# Patient Record
Sex: Male | Born: 1945
Health system: Southern US, Community
[De-identification: ages and names within clinical notes are randomized; demographics above are authoritative.]

## PROBLEM LIST (undated history)

## (undated) DIAGNOSIS — E7800 Pure hypercholesterolemia, unspecified: Secondary | ICD-10-CM

## (undated) DIAGNOSIS — R7303 Prediabetes: Secondary | ICD-10-CM

## (undated) DIAGNOSIS — I10 Essential (primary) hypertension: Secondary | ICD-10-CM

## (undated) DIAGNOSIS — D869 Sarcoidosis, unspecified: Secondary | ICD-10-CM

## (undated) DIAGNOSIS — J449 Chronic obstructive pulmonary disease, unspecified: Secondary | ICD-10-CM

---
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
## 2018-11-19 ENCOUNTER — Emergency Department (HOSPITAL_BASED_OUTPATIENT_CLINIC_OR_DEPARTMENT_OTHER)
Admission: EM | Admit: 2018-11-19 | Discharge: 2018-11-19 | Disposition: A | Discharge status: Home / Self Care | Payer: Medicare Other | Attending: Emergency Medicine | Admitting: Emergency Medicine

## 2018-11-19 ENCOUNTER — Emergency Department (HOSPITAL_BASED_OUTPATIENT_CLINIC_OR_DEPARTMENT_OTHER): Payer: Medicare Other

## 2018-11-19 ENCOUNTER — Other Ambulatory Visit: Payer: Self-pay

## 2018-11-19 ENCOUNTER — Encounter (HOSPITAL_BASED_OUTPATIENT_CLINIC_OR_DEPARTMENT_OTHER): Payer: Self-pay

## 2018-11-19 DIAGNOSIS — Z20828 Contact with and (suspected) exposure to other viral communicable diseases: Secondary | ICD-10-CM | POA: Diagnosis not present

## 2018-11-19 DIAGNOSIS — R509 Fever, unspecified: Secondary | ICD-10-CM | POA: Diagnosis not present

## 2018-11-19 DIAGNOSIS — Z87891 Personal history of nicotine dependence: Secondary | ICD-10-CM | POA: Insufficient documentation

## 2018-11-19 DIAGNOSIS — I10 Essential (primary) hypertension: Secondary | ICD-10-CM | POA: Diagnosis not present

## 2018-11-19 DIAGNOSIS — R1084 Generalized abdominal pain: Secondary | ICD-10-CM | POA: Diagnosis not present

## 2018-11-19 HISTORY — DX: Essential (primary) hypertension: I10

## 2018-11-19 HISTORY — DX: Pure hypercholesterolemia, unspecified: E78.00

## 2018-11-19 LAB — COMPREHENSIVE METABOLIC PANEL
ALT: 22 U/L (ref 0–44)
AST: 31 U/L (ref 15–41)
Albumin: 3.8 g/dL (ref 3.5–5.0)
Alkaline Phosphatase: 62 U/L (ref 38–126)
Anion gap: 13 (ref 5–15)
BUN: 9 mg/dL (ref 8–23)
CO2: 25 mmol/L (ref 22–32)
Calcium: 9.1 mg/dL (ref 8.9–10.3)
Chloride: 97 mmol/L — ABNORMAL LOW (ref 98–111)
Creatinine, Ser: 1.13 mg/dL (ref 0.61–1.24)
GFR calc Af Amer: >60 mL/min (ref >60)
GFR calc non Af Amer: >60 mL/min (ref >60)
Glucose, Bld: 122 mg/dL — ABNORMAL HIGH (ref 70–99)
Potassium: 2.9 mmol/L — ABNORMAL LOW (ref 3.5–5.1)
Sodium: 135 mmol/L (ref 135–145)
Total Bilirubin: 0.6 mg/dL (ref 0.3–1.2)
Total Protein: 7.6 g/dL (ref 6.5–8.1)

## 2018-11-19 LAB — URINALYSIS, MICROSCOPIC (REFLEX): WBC, UA: NONE SEEN WBC/hpf (ref 0–5)

## 2018-11-19 LAB — LACTIC ACID, PLASMA
Lactic Acid, Venous: 0.9 mmol/L (ref 0.5–1.9)
Lactic Acid, Venous: 0.8 mmol/L (ref 0.5–1.9)

## 2018-11-19 LAB — CBC WITH DIFFERENTIAL/PLATELET
Abs Immature Granulocytes: 0.02 10*3/uL (ref 0.00–0.07)
Basophils Absolute: 0 10*3/uL (ref 0.0–0.1)
Basophils Relative: 0 %
Eosinophils Absolute: 0 10*3/uL (ref 0.0–0.5)
Eosinophils Relative: 0 %
HCT: 39.7 % (ref 39.0–52.0)
Hemoglobin: 13.4 g/dL (ref 13.0–17.0)
Immature Granulocytes: 1 %
Lymphocytes Relative: 27 %
Lymphs Abs: 0.7 10*3/uL (ref 0.7–4.0)
MCH: 31.5 pg (ref 26.0–34.0)
MCHC: 33.8 g/dL (ref 30.0–36.0)
MCV: 93.2 fL (ref 80.0–100.0)
Monocytes Absolute: 0.1 10*3/uL (ref 0.1–1.0)
Monocytes Relative: 4 %
Neutro Abs: 1.8 10*3/uL (ref 1.7–7.7)
Neutrophils Relative %: 68 %
Platelets: 265 10*3/uL (ref 150–400)
RBC: 4.26 MIL/uL (ref 4.22–5.81)
RDW: 13.1 % (ref 11.5–15.5)
WBC: 2.7 10*3/uL — ABNORMAL LOW (ref 4.0–10.5)
nRBC: 0 % (ref 0.0–0.2)

## 2018-11-19 LAB — URINALYSIS, ROUTINE W REFLEX MICROSCOPIC
Glucose, UA: NEGATIVE mg/dL
Ketones, ur: 40 mg/dL — AB
Leukocytes,Ua: NEGATIVE
Nitrite: NEGATIVE
Protein, ur: 100 mg/dL — AB
Specific Gravity, Urine: 1.02 (ref 1.005–1.030)
pH: 6 (ref 5.0–8.0)

## 2018-11-19 LAB — SARS CORONAVIRUS 2 AG (30 MIN TAT): SARS Coronavirus 2 Ag: NEGATIVE

## 2018-11-19 LAB — LIPASE, BLOOD: Lipase: 37 U/L (ref 11–51)

## 2018-11-19 MED ORDER — MORPHINE SULFATE (PF) 2 MG/ML IV SOLN
2.0000 mg | Freq: Once | INTRAVENOUS | Status: AC
  Administered 2018-11-19: 2 mg via INTRAVENOUS
  Filled 2018-11-19: qty 1

## 2018-11-19 MED ORDER — IOHEXOL 300 MG/ML  SOLN
100.0000 mL | Freq: Once | INTRAMUSCULAR | Status: AC | PRN
  Administered 2018-11-19: 100 mL via INTRAVENOUS

## 2018-11-19 MED ORDER — SODIUM CHLORIDE 0.9 % IV BOLUS
1000.0000 mL | Freq: Once | INTRAVENOUS | Status: AC
  Administered 2018-11-19: 1000 mL via INTRAVENOUS

## 2018-11-19 MED ORDER — ACETAMINOPHEN 500 MG PO TABS
1000.0000 mg | ORAL_TABLET | Freq: Once | ORAL | Status: AC
  Administered 2018-11-19: 1000 mg via ORAL
  Filled 2018-11-19: qty 2

## 2018-11-19 MED ORDER — ONDANSETRON HCL 4 MG/2ML IJ SOLN
4.0000 mg | Freq: Once | INTRAMUSCULAR | Status: AC
  Administered 2018-11-19: 4 mg via INTRAVENOUS
  Filled 2018-11-19: qty 2

## 2018-11-19 NOTE — Discharge Instructions (Signed)
Please call your family doctor and discuss your visit here.  Please return at any time your abdominal pain gets worse you are unable to eat or drink or if you just want to be reassessed.  At this point we did not find a cause for your fever.  Certainly at this time of our lives the coronavirus is a possibility.  Your initial test here was negative but I will send an outpatient test to further assess for this.

## 2018-11-19 NOTE — ED Triage Notes (Signed)
Pt c/o abd pain, n/v/d started 6/5-was at PCP on Monday-states "pill not working"-NAD-to triage in w/c

## 2018-11-19 NOTE — ED Provider Notes (Signed)
[facility] EMERGENCY DEPARTMENT Provider Note   CSN: [id number] Arrival date & time: 11/19/18  1747    History   Chief Complaint Chief Complaint  Patient presents with   Abdominal Pain    HPI Sean Brown is a 73 y.o. male.     73 yo M with a chief complaints of diffuse abdominal pain.  This been going on for about 5 days now.  Describes it starting on the left side wrapping around the lower part of his abdomen and then coming up the right side.  Scribes it as a burning pain.  Saw his family doctor at the onset of this and was started on some medicine that this was to help him with his stomach.  He is unsure exactly what it was.  He has been having some episodes where he sweats and he gets really cold no measured temperature at home.  Has been coughing as well.  Denies urinary symptoms.  Denies flank pain.  The history is provided by the patient.  Abdominal Pain  Pain location:  Generalized Pain quality: burning   Pain severity:  Moderate Onset quality:  Gradual Duration:  5 days Timing:  Constant Progression:  Worsening Chronicity:  New Relieved by:  Nothing Worsened by:  Nothing Ineffective treatments:  None tried Associated symptoms: chills, cough and fever (subjective)   Associated symptoms: no chest pain, no diarrhea, no shortness of breath and no vomiting     Past Medical History:  Diagnosis Date   High cholesterol    Hypertension    Prediabetes     There are no active problems to display for this patient.   History reviewed. No pertinent surgical history.      Home Medications    Prior to Admission medications   Not on File    Family History No family history on file.  Social History Social History   Tobacco Use   Smoking status: Former Smoker   Smokeless tobacco: Never Used  Substance Use Topics   Alcohol use: Yes    Comment: weekly   Drug use: Never     Allergies   Patient has no known allergies.   Review of  Systems Review of Systems  Constitutional: Positive for chills and fever (subjective).  HENT: Negative for congestion and facial swelling.   Eyes: Negative for discharge and visual disturbance.  Respiratory: Positive for cough. Negative for shortness of breath.   Cardiovascular: Negative for chest pain and palpitations.  Gastrointestinal: Positive for abdominal pain. Negative for diarrhea and vomiting.  Musculoskeletal: Negative for arthralgias and myalgias.  Skin: Negative for color change and rash.  Neurological: Negative for tremors, syncope and headaches.  Psychiatric/Behavioral: Negative for confusion and dysphoric mood.     Physical Exam Updated Vital Signs BP (!) 148/94    Pulse 96    Temp (!) 103.3 F (39.6 C) (Oral)    Resp 16    Ht 6\' 1"  (1.854 m)    Wt 94.8 kg    SpO2 96%    BMI 27.57 kg/m   Physical Exam Vitals signs and nursing note reviewed.  Constitutional:      Appearance: He is well-developed.  HENT:     Head: Normocephalic and atraumatic.  Eyes:     Pupils: Pupils are equal, round, and reactive to light.  Neck:     Musculoskeletal: Normal range of motion and neck supple.     Vascular: No JVD.  Cardiovascular:     Rate and Rhythm:  Normal rate and regular rhythm.     Heart sounds: No murmur. No friction rub. No gallop.   Pulmonary:     Effort: No respiratory distress.     Breath sounds: No wheezing.  Abdominal:     General: There is no distension.     Tenderness: There is abdominal tenderness (mild diffuse). There is no guarding or rebound.  Musculoskeletal: Normal range of motion.  Skin:    Coloration: Skin is not pale.     Findings: No rash.  Neurological:     Mental Status: He is alert and oriented to person, place, and time.  Psychiatric:        Behavior: Behavior normal.      ED Treatments / Results  Labs (all labs ordered are listed, but only abnormal results are displayed) Labs Reviewed  CBC WITH DIFFERENTIAL/PLATELET - Abnormal; Notable  for the following components:      Result Value   WBC 2.7 (*)    All other components within normal limits  COMPREHENSIVE METABOLIC PANEL - Abnormal; Notable for the following components:   Potassium 2.9 (*)    Chloride 97 (*)    Glucose, Bld 122 (*)    All other components within normal limits  URINALYSIS, ROUTINE W REFLEX MICROSCOPIC - Abnormal; Notable for the following components:   Hgb urine dipstick SMALL (*)    Bilirubin Urine SMALL (*)    Ketones, ur 40 (*)    Protein, ur 100 (*)    All other components within normal limits  URINALYSIS, MICROSCOPIC (REFLEX) - Abnormal; Notable for the following components:   Bacteria, UA RARE (*)    All other components within normal limits  SARS CORONAVIRUS 2 (HOSP ORDER, PERFORMED IN [place] [facility] VIA ABBOTT ID)  URINE CULTURE  CULTURE, BLOOD (ROUTINE X 2)  CULTURE, BLOOD (ROUTINE X 2)  NOVEL CORONAVIRUS, NAA (HOSPITAL ORDER, SEND-OUT TO REF LAB)  LACTIC ACID, PLASMA  LACTIC ACID, PLASMA  LIPASE, BLOOD    EKG None  Radiology Ct Abdomen Pelvis W Contrast  Result Date: 11/19/2018 CLINICAL DATA:  Abdominal pain. Nausea, vomiting, and diarrhea started 11/15/2018. Fever. EXAM: CT ABDOMEN AND PELVIS WITH CONTRAST TECHNIQUE: Multidetector CT imaging of the abdomen and pelvis was performed using the standard protocol following bolus administration of intravenous contrast. CONTRAST:  100mL OMNIPAQUE IOHEXOL 300 MG/ML  SOLN COMPARISON:  None. FINDINGS: Lower chest: Unremarkable Hepatobiliary: Unremarkable Pancreas: Unremarkable Spleen: Unremarkable Adrenals/Urinary Tract: Fullness of both adrenal glands. 3.7 by 3.3 cm left kidney upper pole fluid density exophytic lesion compatible cyst. Mild bilateral perirenal stranding, chronicity uncertain. No urinary tract calculi. The prostate gland indents the bladder base. Stomach/Bowel: Mild diverticulosis of the descending colon. Normal appendix. Orally administered contrast extends down to the rectum.  Vascular/Lymphatic: Aortoiliac atherosclerotic vascular disease. Scattered small calcified lymph nodes in the porta hepatis and portacaval region. An aortocaval lymph node measures 1.3 cm in short axis on image 35/2. Reproductive: Mild prostatomegaly with the median lobe of the prostate mildly indenting the bladder base. Other: Subtle stranding and small lymph nodes along the left eccentric small-bowel mesentery for example on image 39/5. Musculoskeletal: Lumbar spondylosis and degenerative disc disease causing impingement at L3-4, L4-5, L5-S1. IMPRESSION: 1. A specific cause for the patient's gastrointestinal symptoms is not identified. However, there is some low-grade stranding and small lymph nodes in the mesentery which may be reactive. 2. A mildly enlarged aortocaval lymph node measures 1.3 cm in diameter. In the absence of other pathologically prominent adenopathy this  is most likely reactive/incidental rather than neoplastic. 3. Small calcified porta hepatis lymph nodes compatible with old granulomatous disease. 4. Mild diverticulosis of the descending colon without active diverticulitis. 5.  Aortic Atherosclerosis (ICD10-I70.0). 6. Mild prostatomegaly. 7. Lower lumbar multilevel impingement due to spondylosis and degenerative disc disease. Electronically Signed   By: Gaylyn RongWalter  Liebkemann M.D.   On: 11/19/2018 20:21   Dg Chest Port 1 View  Result Date: 11/19/2018 CLINICAL DATA:  Abdominal pain, nausea vomiting and diarrhea beginning 4 days ago. EXAM: PORTABLE CHEST 1 VIEW COMPARISON:  None. FINDINGS: The heart size and mediastinal contours are within normal limits. Both lungs are clear. The visualized skeletal structures are unremarkable. IMPRESSION: No active disease. Electronically Signed   By: Paulina FusiMark  Shogry M.D.   On: 11/19/2018 19:57    Procedures Procedures (including critical care time)  Medications Ordered in ED Medications  sodium chloride 0.9 % bolus 1,000 mL ( Intravenous Stopped 11/19/18  1926)  acetaminophen (TYLENOL) tablet 1,000 mg (1,000 mg Oral Given 11/19/18 1848)  morphine 2 MG/ML injection 2 mg (2 mg Intravenous Given 11/19/18 1850)  ondansetron (ZOFRAN) injection 4 mg (4 mg Intravenous Given 11/19/18 1849)  iohexol (OMNIPAQUE) 300 MG/ML solution 100 mL (100 mLs Intravenous Contrast Given 11/19/18 1957)     Initial Impression / Assessment and Plan / ED Course  I have reviewed the triage vital signs and the nursing notes.  Pertinent labs & imaging results that were available during my care of the patient were reviewed by me and considered in my medical decision making (see chart for details).        73 yo M with a chief complaints of diffuse abdominal pain.  Going on for about 5 days.  Patient was noted to have a temperature of 103 on arrival here.  Heart rate in the 110's initially though resolved by the time of my exam.  Patient is well-appearing and nontoxic.  Will obtain a CT scan of the abdomen pelvis.  He is having some cough will obtain a chest x-ray.  UA lactate blood cultures. Fluids pain and nausea meds.  Reassess.   Patient continues to feel well, reassessments no significant issues.  Chest x-ray without focal infiltrate as viewed by me.  Lactate is normal.  Patient's white blood cell count is a little bit low.  CT scan without obvious intra-abdominal pathology.  My view of the CT scan with some mild inflammation around bilateral kidneys and bladder.  I suspect that he may have a urinary tract infection.  Awaiting UA.  UA is negative for infection.  Patient clinically is still well-appearing.  I discussed the results with him and offered observation in the hospital which she is currently declining.  He will return for worsening symptoms.  I will hold off on antibiotics with no obvious source of infection.  His rapid test here for the coronavirus is negative though there is some concern that that test is not accurate.  I will send the outpatient test.  Have him self  isolate.  Sean BougieGeorge Brown was evaluated in Emergency Department on 11/19/2018 for the symptoms described in the history of present illness. He/she was evaluated in the context of the global COVID-19 pandemic, which necessitated consideration that the patient might be at risk for infection with the SARS-CoV-2 virus that causes COVID-19. Institutional protocols and algorithms that pertain to the evaluation of patients at risk for COVID-19 are in a state of rapid change based on information released by regulatory bodies including the  CDC and federal and [employer]. These policies and algorithms were followed during the patient's care in the ED.  9:20 PM:  I have discussed the diagnosis/risks/treatment options with the patient and believe the pt to be eligible for discharge home to follow-up with PCP. We also discussed returning to the ED immediately if new or worsening sx occur. We discussed the sx which are most concerning (e.g., sudden worsening pain, fever, inability to tolerate by mouth) that necessitate immediate return. Medications administered to the patient during their visit and any new prescriptions provided to the patient are listed below.  Medications given during this visit Medications  sodium chloride 0.9 % bolus 1,000 mL ( Intravenous Stopped 11/19/18 1926)  acetaminophen (TYLENOL) tablet 1,000 mg (1,000 mg Oral Given 11/19/18 1848)  morphine 2 MG/ML injection 2 mg (2 mg Intravenous Given 11/19/18 1850)  ondansetron (ZOFRAN) injection 4 mg (4 mg Intravenous Given 11/19/18 1849)  iohexol (OMNIPAQUE) 300 MG/ML solution 100 mL (100 mLs Intravenous Contrast Given 11/19/18 1957)     The patient appears reasonably screen and/or stabilized for discharge and I doubt any other medical condition or other [facility] requiring further screening, evaluation, or treatment in the ED at this time prior to discharge.    Final Clinical Impressions(s) / ED Diagnoses   Final diagnoses:  Generalized abdominal pain    Fever of unknown origin    ED Discharge Orders    None       Deno Etienne, DO 11/19/18 2120

## 2018-11-21 LAB — URINE CULTURE
Culture: NO GROWTH
Special Requests: NORMAL

## 2018-11-21 LAB — NOVEL CORONAVIRUS, NAA (HOSP ORDER, SEND-OUT TO REF LAB; TAT 18-24 HRS): SARS-CoV-2, NAA: NOT DETECTED

## 2018-11-24 LAB — CULTURE, BLOOD (ROUTINE X 2)
Culture: NO GROWTH
Culture: NO GROWTH
Special Requests: ADEQUATE
Special Requests: ADEQUATE

## 2019-06-23 DIAGNOSIS — H44119 Panuveitis, unspecified eye: Secondary | ICD-10-CM | POA: Insufficient documentation

## 2019-06-23 DIAGNOSIS — E119 Type 2 diabetes mellitus without complications: Secondary | ICD-10-CM | POA: Insufficient documentation

## 2019-06-23 DIAGNOSIS — H31001 Unspecified chorioretinal scars, right eye: Secondary | ICD-10-CM | POA: Insufficient documentation

## 2019-06-23 DIAGNOSIS — H35063 Retinal vasculitis, bilateral: Secondary | ICD-10-CM | POA: Insufficient documentation

## 2019-07-24 DIAGNOSIS — B5801 Toxoplasma chorioretinitis: Secondary | ICD-10-CM | POA: Insufficient documentation

## 2019-12-30 ENCOUNTER — Encounter (HOSPITAL_BASED_OUTPATIENT_CLINIC_OR_DEPARTMENT_OTHER): Payer: Self-pay | Admitting: Emergency Medicine

## 2019-12-30 ENCOUNTER — Other Ambulatory Visit: Payer: Self-pay

## 2019-12-30 ENCOUNTER — Emergency Department (HOSPITAL_BASED_OUTPATIENT_CLINIC_OR_DEPARTMENT_OTHER): Payer: Medicare Other

## 2019-12-30 ENCOUNTER — Emergency Department (HOSPITAL_BASED_OUTPATIENT_CLINIC_OR_DEPARTMENT_OTHER)
Admission: EM | Admit: 2019-12-30 | Discharge: 2019-12-30 | Disposition: A | Discharge status: Home / Self Care | Payer: Medicare Other | Attending: Emergency Medicine | Admitting: Emergency Medicine

## 2019-12-30 DIAGNOSIS — Y9389 Activity, other specified: Secondary | ICD-10-CM | POA: Insufficient documentation

## 2019-12-30 DIAGNOSIS — W19XXXA Unspecified fall, initial encounter: Secondary | ICD-10-CM

## 2019-12-30 DIAGNOSIS — S3992XA Unspecified injury of lower back, initial encounter: Secondary | ICD-10-CM | POA: Insufficient documentation

## 2019-12-30 DIAGNOSIS — Y999 Unspecified external cause status: Secondary | ICD-10-CM | POA: Insufficient documentation

## 2019-12-30 DIAGNOSIS — Z87891 Personal history of nicotine dependence: Secondary | ICD-10-CM | POA: Insufficient documentation

## 2019-12-30 DIAGNOSIS — W07XXXA Fall from chair, initial encounter: Secondary | ICD-10-CM | POA: Diagnosis not present

## 2019-12-30 DIAGNOSIS — I10 Essential (primary) hypertension: Secondary | ICD-10-CM | POA: Insufficient documentation

## 2019-12-30 DIAGNOSIS — Y92009 Unspecified place in unspecified non-institutional (private) residence as the place of occurrence of the external cause: Secondary | ICD-10-CM | POA: Diagnosis not present

## 2019-12-30 DIAGNOSIS — J449 Chronic obstructive pulmonary disease, unspecified: Secondary | ICD-10-CM | POA: Diagnosis not present

## 2019-12-30 DIAGNOSIS — M5442 Lumbago with sciatica, left side: Secondary | ICD-10-CM | POA: Diagnosis not present

## 2019-12-30 HISTORY — DX: Chronic obstructive pulmonary disease, unspecified: J44.9

## 2019-12-30 HISTORY — DX: Sarcoidosis, unspecified: D86.9

## 2019-12-30 MED ORDER — METHOCARBAMOL 500 MG PO TABS: 500.0000 mg | ORAL_TABLET | Freq: Three times a day (TID) | ORAL | 0 refills | Status: AC | PRN

## 2019-12-30 MED FILL — METHOCARBAMOL 500 MG TABS: 500 | 10 days supply | Qty: 30 | Fill #0

## 2019-12-30 NOTE — ED Provider Notes (Signed)
[facility]-EMERGENCY DEPT [facility] Emergency Department Provider Note MRN:  [id number]  Arrival date & time: 12/30/19     Chief Complaint   Fall   History of Present Illness   Sean Brown is a 74 y.o. year-old male with a history of hypertension, COPD presenting to the ED with chief complaint of fall.  Patient fell from a chair this past Saturday onto his buttocks.  Felt fine that day afterwards.  No head trauma, no loss of consciousness.  The next day while sitting on the chairs at church she began experiencing lower back pain with some radiation down the back of the left leg.  Constant since then, moderate in severity, worse with motion or palpation.  Denies fever, no neck pain, no chest pain or shortness of breath, no abdominal pain.  No issues with bowel movements or urination, no numbness or weakness.  Review of Systems  A complete 10 system review of systems was obtained and all systems are negative except as noted in the HPI and PMH.   Patient's Health History    Past Medical History:  Diagnosis Date  . COPD (chronic obstructive pulmonary disease) (HCC)   . High cholesterol   . Hypertension   . Sarcoidosis     History reviewed. No pertinent surgical history.  No family history on file.  Social History   Socioeconomic History  . Marital status: Married    Spouse name: Not on file  . Number of children: Not on file  . Years of education: Not on file  . Highest education level: Not on file  Occupational History  . Not on file  Tobacco Use  . Smoking status: Former Games developer  . Smokeless tobacco: Never Used  Vaping Use  . Vaping Use: Never used  Substance and Sexual Activity  . Alcohol use: Yes    Comment: weekly  . Drug use: Never  . Sexual activity: Not on file  Other Topics Concern  . Not on file  Social History Narrative  . Not on file   Social Determinants of Health   Financial Resource Strain:   . Difficulty of Paying Living Expenses:   Food  Insecurity:   . Worried About Programme researcher, broadcasting/film/video in the Last Year:   . Barista in the Last Year:   Transportation Needs:   . Freight forwarder (Medical):   Marland Kitchen Lack of Transportation (Non-Medical):   Physical Activity:   . Days of Exercise per Week:   . Minutes of Exercise per Session:   Stress:   . Feeling of Stress :   Social Connections:   . Frequency of Communication with Friends and Family:   . Frequency of Social Gatherings with Friends and Family:   . Attends Religious Services:   . Active Member of Clubs or Organizations:   . Attends Banker Meetings:   Marland Kitchen Marital Status:   Intimate Partner Violence:   . Fear of Current or Ex-Partner:   . Emotionally Abused:   Marland Kitchen Physically Abused:   . Sexually Abused:      Physical Exam   Vitals:   12/30/19 0804  BP: (!) 155/106  Pulse: 87  Resp: 16  Temp: 98.7 F (37.1 C)  SpO2: 99%    CONSTITUTIONAL: Well-appearing, NAD NEURO:  Alert and oriented x 3, no focal deficits EYES:  eyes equal and reactive ENT/NECK:  no LAD, no JVD CARDIO: Regular rate, well-perfused, normal S1 and S2 PULM:  CTAB no  wheezing or rhonchi GI/GU:  normal bowel sounds, non-distended, non-tender MSK/SPINE:  No gross deformities, no edema; tenderness palpation to the left lumbar back region SKIN:  no rash, atraumatic PSYCH:  Appropriate speech and behavior  *Additional and/or pertinent findings included in MDM below  Diagnostic and Interventional Summary    EKG Interpretation  Date/Time:    Ventricular Rate:    PR Interval:    QRS Duration:   QT Interval:    QTC Calculation:   R Axis:     Text Interpretation:        Labs Reviewed - No data to display  DG Lumbar Spine Complete  Final Result      Medications - No data to display   Procedures  /  Critical Care Procedures  ED Course and Medical Decision Making  I have reviewed the triage vital signs, the nursing notes, and pertinent available records from the  EMR.  Listed above are laboratory and imaging tests that I personally ordered, reviewed, and interpreted and then considered in my medical decision making (see below for details).      Given age will obtain plain film to screen for compression fractures, however favoring muscular strain or spasm, possibly with mild sciatica given the radiation down the left leg.  No red flag symptoms to suggest myelopathy, suspect discharge.  X-ray with chronic findings but no acute fracture, patient is neurologically intact with some mild sciatica type symptoms.  Appropriate discharge.  Elmer Sow. Pilar Plate, MD [facility] Emergency Medicine [facility] [email or web address]  Final Clinical Impressions(s) / ED Diagnoses     ICD-10-CM   1. Fall, initial encounter  W19.XXXA   2. Acute left-sided low back pain with left-sided sciatica  M54.42     ED Discharge Orders         Ordered    methocarbamol (ROBAXIN) 500 MG tablet  Every 8 hours PRN     Discontinue  Reprint     12/30/19 0930           Discharge Instructions Discussed with and Provided to Patient:     Discharge Instructions     You were evaluated in the Emergency Department and after careful evaluation, we did not find any emergent condition requiring admission or further testing in the hospital.  Your exam/testing today was overall reassuring.  Your x-ray did not show any broken bones.  As discussed, we recommend Tylenol 1000 mg every 4-6 hours as well as Motrin 600 mg every 4-6 hours for discomfort.  You can also use the Robaxin muscle relaxers at night for pain keeping you from sleeping.  Please return to the Emergency Department if you experience any worsening of your condition.  Thank you for allowing [place] to be a part of your care.        Sabas Sous, MD 12/30/19 [phone]

## 2019-12-30 NOTE — ED Notes (Signed)
Pt discharged to home. Discharge instructions have been discussed with patient and/or family members. Pt verbally acknowledges understanding d/c instructions, and endorses comprehension to checkout at registration before leaving.  °

## 2019-12-30 NOTE — ED Triage Notes (Signed)
Pt states he was attempting to sit in a chair and fell.  Pt has bruising noted to upper middle and left back.  Pt c/o left hip pain, no bruising visualized at hip.

## 2019-12-30 NOTE — Discharge Instructions (Addendum)
You were evaluated in the Emergency Department and after careful evaluation, we did not find any emergent condition requiring admission or further testing in the hospital.  Your exam/testing today was overall reassuring.  Your x-ray did not show any broken bones.  As discussed, we recommend Tylenol 1000 mg every 4-6 hours as well as Motrin 600 mg every 4-6 hours for discomfort.  You can also use the Robaxin muscle relaxers at night for pain keeping you from sleeping.  Please return to the Emergency Department if you experience any worsening of your condition.  Thank you for allowing [place] to be a part of your care.

## 2021-05-13 IMAGING — CR DG LUMBAR SPINE COMPLETE 4+V
5 series · 5 of 5 positions shown · non-contrast
Comparison: CT abdomen and pelvis evaluation of 03/04/2019

CLINICAL DATA: Fall, back pain and LEFT lumbar region.

EXAM:
LUMBAR SPINE - COMPLETE 4+ VIEW

[t l-spine a.p.]
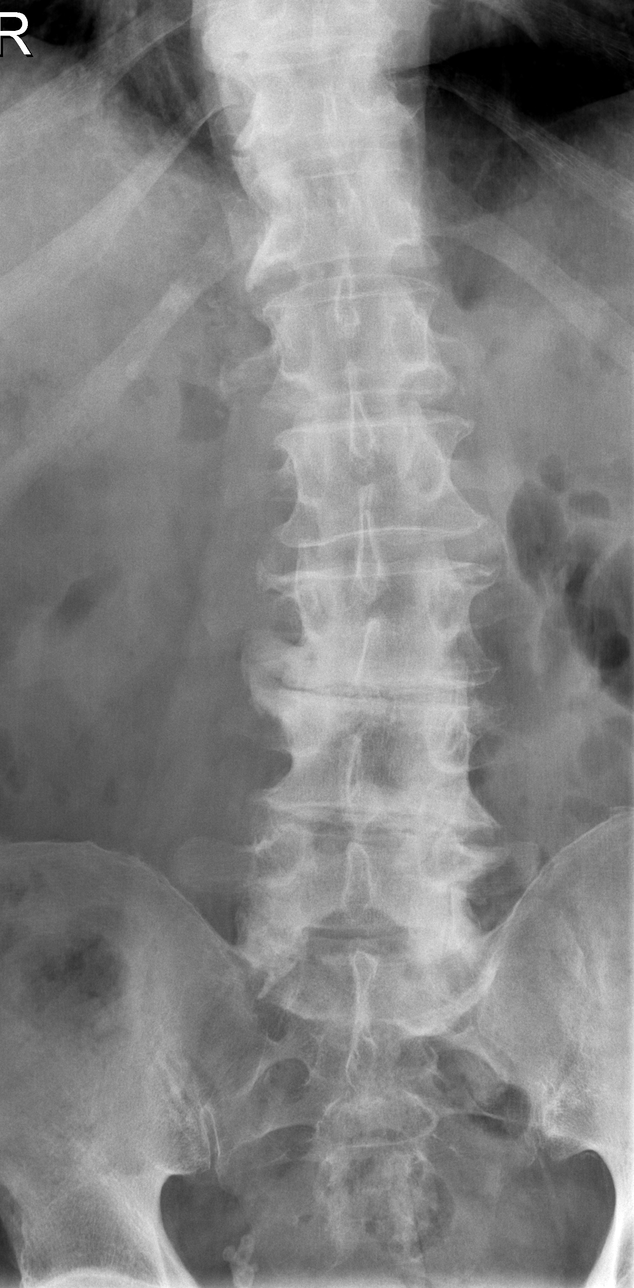

[t l-spine oblique exposure (1 of 2)]
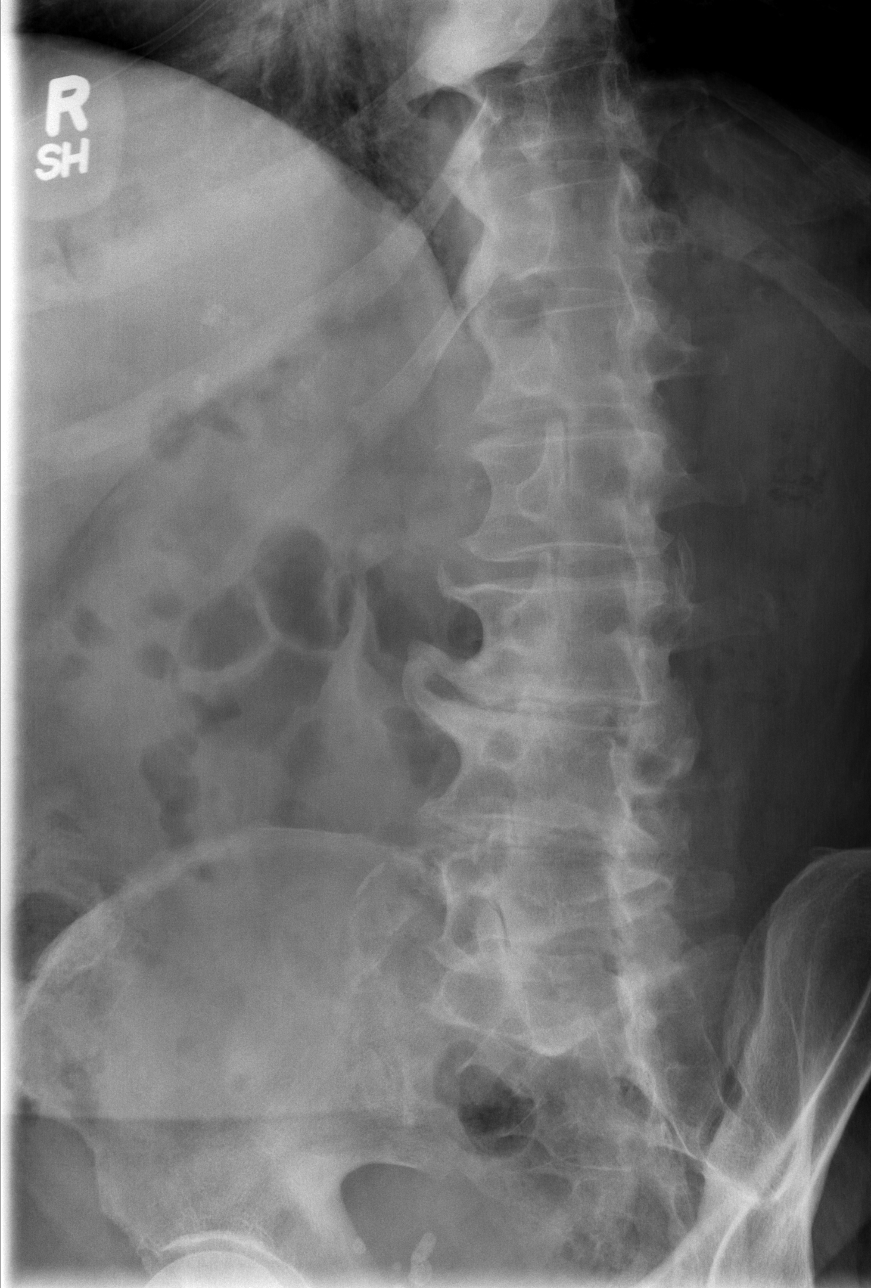

[t l-spine oblique exposure (2 of 2)]
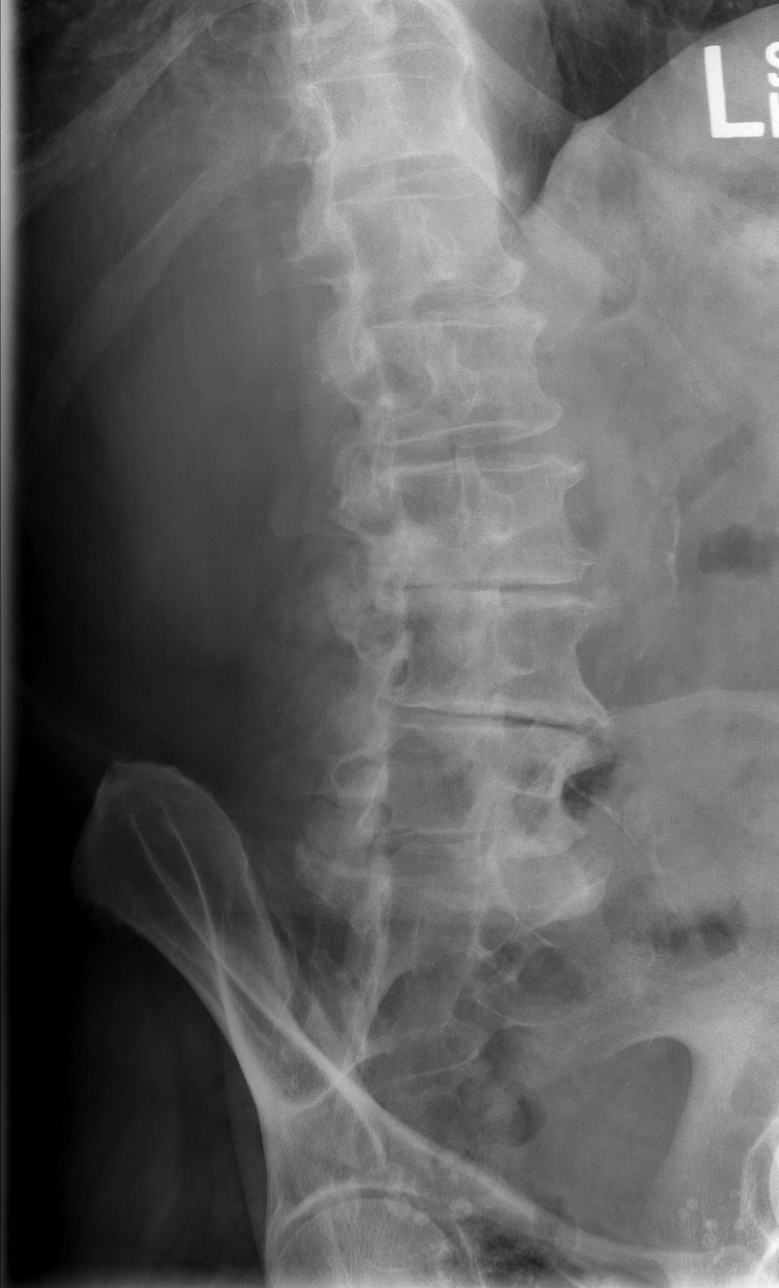

[t l-spine lat]
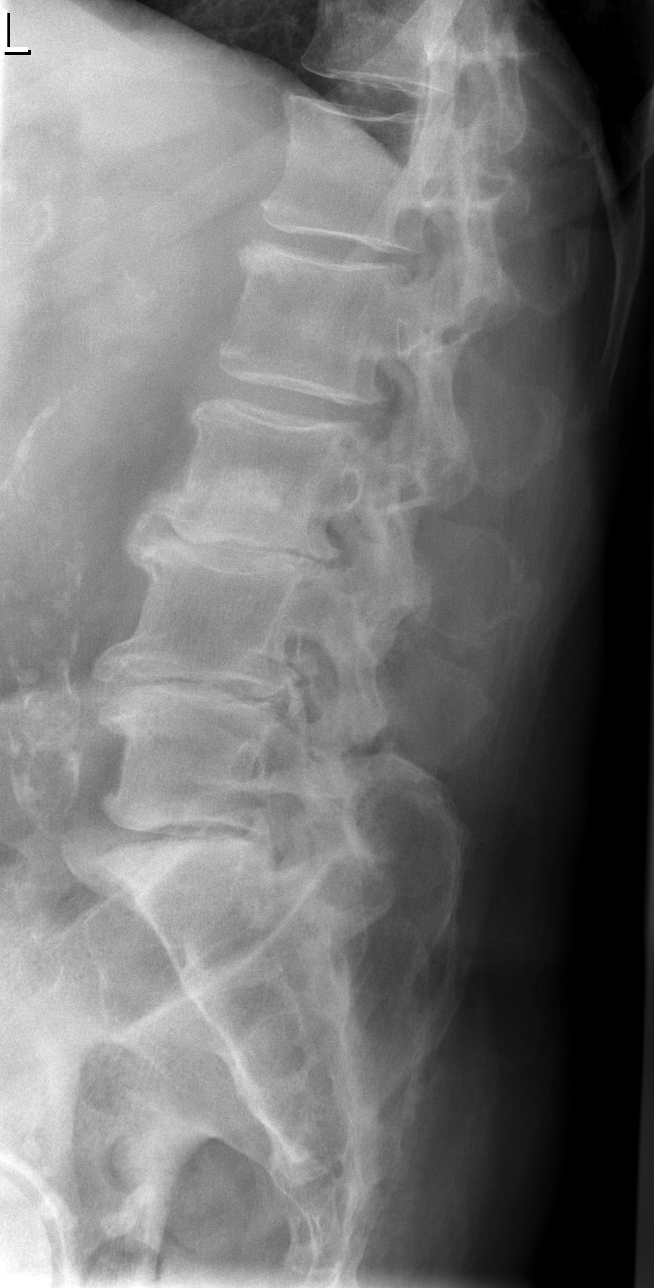

[t l-spine l5-s1 spot]
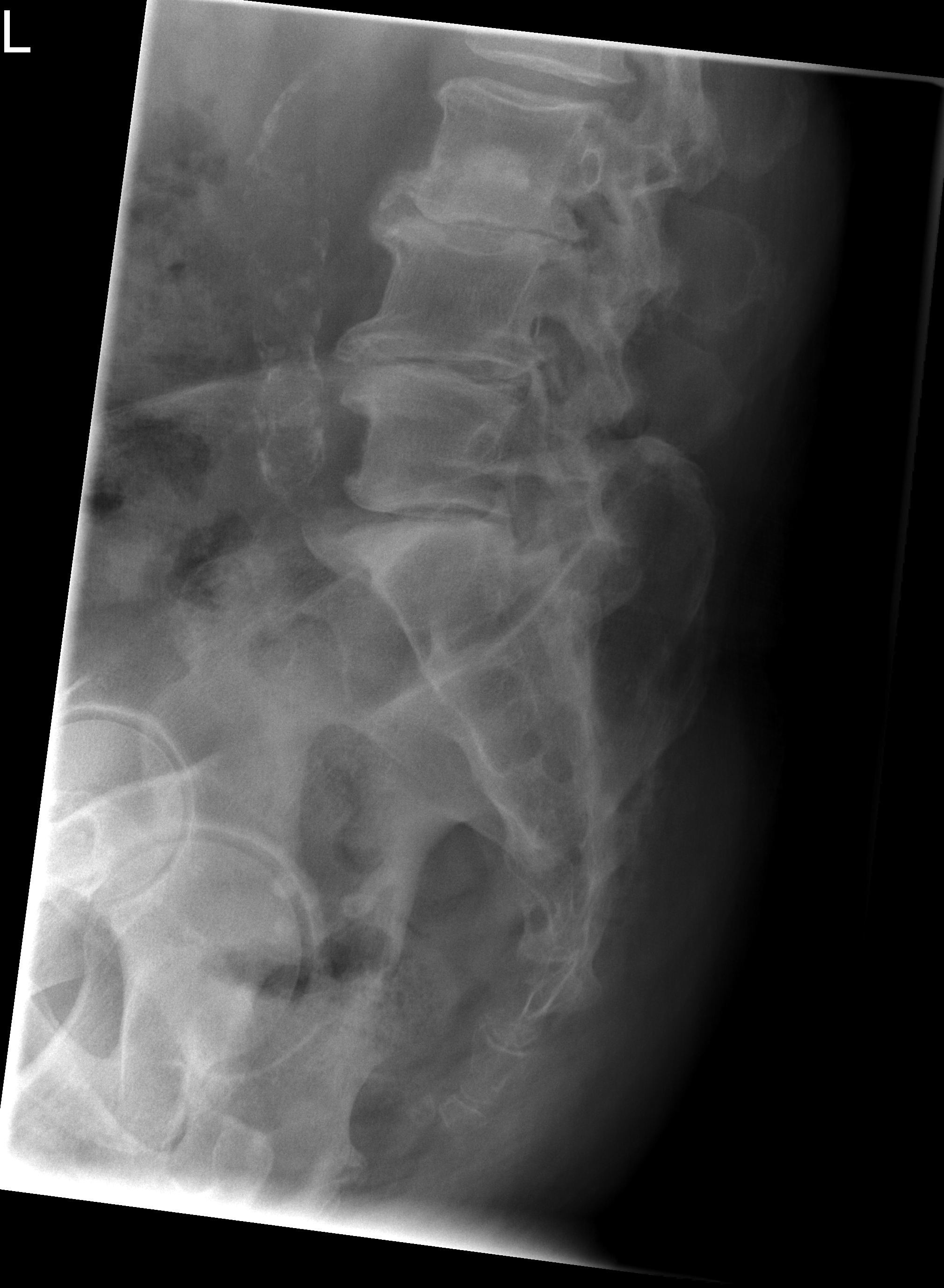

[5 of 5 positions shown; findings below may reference images not displayed]

FINDINGS: Five lumbar type vertebral bodies.

Mild levoconvex curvature of the lumbar spine centered about L2-3.
Marked lumbar spine degenerative change.

Mild degenerative changes at L1-2 and L2-3.

Minimal retrolisthesis of L1 on L2 and L2 on L3, perhaps slightly
worse than in Monday February, 2019, approximately 4 mm at L1-L2 and 3
mm at L2-L3.

Marked disc space narrowing at L3-4 L4-5 and L5-S1 greatest at L3-4.
Similar degree of retrolisthesis of L3 on L4, approximately 3 mm.

No signs of fracture.
IMPRESSION: Marked degenerative changes of the lumbar spine greatest at L3-4 and
L4-5 with slight interval worsening of retrolisthesis of L1 on L2
and L2 on L3. Findings appear mildly worse than in Monday February, 2019. No sign of fracture.

## 2022-03-05 ENCOUNTER — Other Ambulatory Visit: Payer: Self-pay

## 2022-03-05 ENCOUNTER — Emergency Department (HOSPITAL_BASED_OUTPATIENT_CLINIC_OR_DEPARTMENT_OTHER): Payer: Medicare Other

## 2022-03-05 ENCOUNTER — Emergency Department (HOSPITAL_BASED_OUTPATIENT_CLINIC_OR_DEPARTMENT_OTHER)
Admission: EM | Admit: 2022-03-05 | Discharge: 2022-03-05 | Disposition: A | Discharge status: Home / Self Care | Payer: Medicare Other | Attending: Emergency Medicine | Admitting: Emergency Medicine

## 2022-03-05 ENCOUNTER — Encounter (HOSPITAL_BASED_OUTPATIENT_CLINIC_OR_DEPARTMENT_OTHER): Payer: Self-pay | Admitting: Emergency Medicine

## 2022-03-05 DIAGNOSIS — Z79899 Other long term (current) drug therapy: Secondary | ICD-10-CM | POA: Diagnosis not present

## 2022-03-05 DIAGNOSIS — I10 Essential (primary) hypertension: Secondary | ICD-10-CM | POA: Insufficient documentation

## 2022-03-05 DIAGNOSIS — U071 COVID-19: Secondary | ICD-10-CM | POA: Insufficient documentation

## 2022-03-05 DIAGNOSIS — J40 Bronchitis, not specified as acute or chronic: Secondary | ICD-10-CM | POA: Diagnosis not present

## 2022-03-05 DIAGNOSIS — Z7951 Long term (current) use of inhaled steroids: Secondary | ICD-10-CM | POA: Diagnosis not present

## 2022-03-05 DIAGNOSIS — R059 Cough, unspecified: Secondary | ICD-10-CM | POA: Diagnosis present

## 2022-03-05 DIAGNOSIS — J449 Chronic obstructive pulmonary disease, unspecified: Secondary | ICD-10-CM | POA: Insufficient documentation

## 2022-03-05 LAB — SARS CORONAVIRUS 2 BY RT PCR: SARS Coronavirus 2 by RT PCR: POSITIVE — AB

## 2022-03-05 MED ORDER — PREDNISONE 50 MG PO TABS
60.0000 mg | ORAL_TABLET | Freq: Once | ORAL | Status: AC
  Administered 2022-03-05: 60 mg via ORAL
  Filled 2022-03-05: qty 1

## 2022-03-05 MED ORDER — AZITHROMYCIN 250 MG PO TABS: 250.0000 mg | ORAL_TABLET | Freq: Every day | ORAL | 0 refills | Status: AC

## 2022-03-05 MED ORDER — PREDNISONE 20 MG PO TABS: 40.0000 mg | ORAL_TABLET | Freq: Every day | ORAL | 0 refills | Status: AC

## 2022-03-05 NOTE — ED Notes (Signed)
Patient is alert x4. Denies any shortness of breath.States that he has  moist cough. States that he is coughing up brown phlegm. Patient is breathing non labored. States that he is having diarrhea since wed reports 2 stools per day

## 2022-03-05 NOTE — ED Triage Notes (Signed)
Patient c/o productive cough onset Wednesday. Patient had flu shot 3 weeks ago.

## 2022-03-05 NOTE — ED Provider Notes (Addendum)
[facility] EMERGENCY DEPARTMENT Provider Note   CSN: [id number] Arrival date & time: 03/05/22  [id number]     History  Chief Complaint  Patient presents with   Cough    Sean Brown is a 76 y.o. male.  Patient here with cough for the last 5 days.  Mildly productive.  History of COPD.  Inhaler has helped.  Flu shot several weeks ago.  History of high cholesterol, hypertension, sarcoidosis as well.  Denies any fevers or chills or shortness of breath or chest pain or abdominal pain or nausea or vomiting.  Nothing makes it worse or better.  The history is provided by the patient.       Home Medications Prior to Admission medications   Medication Sig Start Date End Date Taking? Authorizing Provider  azithromycin (ZITHROMAX) 250 MG tablet Take 1 tablet (250 mg total) by mouth daily. Take first 2 tablets together, then 1 every day until finished. 03/05/22  Yes Sukanya Goldblatt, DO  predniSONE (DELTASONE) 20 MG tablet Take 2 tablets (40 mg total) by mouth daily for 4 days. 03/05/22 03/09/22 Yes Jaynell Castagnola, DO  benazepril (LOTENSIN) 40 MG tablet  04/08/19   [provider]  carvedilol (COREG) 3.125 MG tablet Take by mouth.    [provider]  famotidine (PEPCID) 20 MG tablet TAKE 1 TABLET BY MOUTH TWICE DAILY FOR 30 DAYS 11/18/18   [provider]  gabapentin (NEURONTIN) 100 MG capsule TAKE 1 CAPSULE BY MOUTH THREE TIMES DAILY FOR 14 DAYS 08/11/19   [provider]  hydrochlorothiazide (HYDRODIURIL) 25 MG tablet Take by mouth.    [provider]  methocarbamol (ROBAXIN) 500 MG tablet Take 1 tablet (500 mg total) by mouth every 8 (eight) hours as needed for muscle spasms. 12/30/19   Maudie Flakes, MD  pravastatin (PRAVACHOL) 40 MG tablet Take by mouth.    [provider]      Allergies    Patient has no known allergies.    Review of Systems   Review of Systems  Physical Exam Updated Vital Signs BP (!) 140/86 (BP Location:  Right Arm)   Pulse 80   Temp 97.9 F (36.6 C) (Oral)   Resp 17   Ht 6' 1.5" (1.867 m)   Wt 95.3 kg   SpO2 96%   BMI 27.33 kg/m  Physical Exam Vitals and nursing note reviewed.  Constitutional:      General: He is not in acute distress.    Appearance: He is well-developed.  HENT:     Head: Normocephalic and atraumatic.     Nose: Nose normal.     Mouth/Throat:     Mouth: Mucous membranes are moist.  Eyes:     Extraocular Movements: Extraocular movements intact.     Conjunctiva/sclera: Conjunctivae normal.     Pupils: Pupils are equal, round, and reactive to light.  Cardiovascular:     Rate and Rhythm: Normal rate and regular rhythm.     Pulses: Normal pulses.     Heart sounds: Normal heart sounds. No murmur heard. Pulmonary:     Effort: Pulmonary effort is normal. No respiratory distress.     Breath sounds: Normal breath sounds.  Abdominal:     Palpations: Abdomen is soft.     Tenderness: There is no abdominal tenderness.  Musculoskeletal:        General: No swelling.     Cervical back: Neck supple.  Skin:    General: Skin is warm and dry.  Capillary Refill: Capillary refill takes less than 2 seconds.  Neurological:     General: No focal deficit present.     Mental Status: He is alert.  Psychiatric:        Mood and Affect: Mood normal.     ED Results / Procedures / Treatments   Labs (all labs ordered are listed, but only abnormal results are displayed) Labs Reviewed  SARS CORONAVIRUS 2 BY RT PCR - Abnormal; Notable for the following components:      Result Value   SARS Coronavirus 2 by RT PCR POSITIVE (*)    All other components within normal limits    EKG None  Radiology DG Chest Portable 1 View  Result Date: 03/05/2022 CLINICAL DATA:  76 year old male with history of productive cough. EXAM: PORTABLE CHEST 1 VIEW COMPARISON:  Chest x-ray 11/28/2019. FINDINGS: Lung volumes are normal. No consolidative airspace disease. No pleural effusions. No  pneumothorax. No pulmonary nodule or mass noted. Pulmonary vasculature and the cardiomediastinal silhouette are within normal limits. Atherosclerosis in the thoracic aorta. IMPRESSION: 1.  No radiographic evidence of acute cardiopulmonary disease. 2. Aortic atherosclerosis. Electronically Signed   By: Vinnie Langton M.D.   On: 03/05/2022 09:50    Procedures Procedures    Medications Ordered in ED Medications  predniSONE (DELTASONE) tablet 60 mg (has no administration in time range)    ED Course/ Medical Decision Making/ A&P                           Medical Decision Making Amount and/or Complexity of Data Reviewed Radiology: ordered.  Risk Prescription drug management.   Sean Brown is here with cough.  Very well-appearing.  Normal vitals.  History of hypertension, high cholesterol, COPD, sarcoidosis.  Differential diagnosis is viral process versus COPD exacerbation/bronchitis, versus pneumonia.  We will get a COVID test and CXR.  COVID test is positive.  This is about the 6 days symptoms and will hold off on any antiviral.  Suspect he has a very minor COPD exacerbation.  Chest x-ray negative for infection per my review and interpretation.  Overall suspect viral bronchitis.  Will treat with prednisone and Z-Pak.  Discharged in good condition.  Understands return precautions.  This chart was dictated using voice recognition software.  Despite best efforts to proofread,  errors can occur which can change the documentation meaning.         Final Clinical Impression(s) / ED Diagnoses Final diagnoses:  Bronchitis  COVID-19    Rx / DC Orders ED Discharge Orders          Ordered    azithromycin (ZITHROMAX) 250 MG tablet  Daily        03/05/22 1017    predniSONE (DELTASONE) 20 MG tablet  Daily        03/05/22 1017              Ravyn Nikkel, [place], DO 03/05/22 1018    Vallen Calabrese, [place], DO 03/05/22 1029

## 2022-03-05 NOTE — Discharge Instructions (Signed)
Start antibiotics and get home today.  Take next dose of steroid tomorrow.  Use inhaler as needed.  Overall suspect you have a viral bronchitis.
